# Patient Record
Sex: Male | Born: 1995 | Race: Black or African American | Hispanic: No | Marital: Single | Smoking: Never smoker
Health system: Southern US, Community
[De-identification: ages and names within clinical notes are randomized; demographics above are authoritative.]

---
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
## 2014-09-18 ENCOUNTER — Telehealth: Payer: Self-pay | Admitting: Family Medicine

## 2014-09-18 NOTE — Telephone Encounter (Signed)
Patient Name: Keith Davenport  DOB: 12/19/1995    Initial Comment Caller states, tick bite last week, now has a red spot and a rash    Nurse Assessment  Nurse: Sherilyn CooterHenry, RN, Thurmond ButtsWade Date/Time (Eastern Time): 09/18/2014 10:14:56 AM  Confirm and document reason for call. If symptomatic, describe symptoms. ---Caller states that he had a tick bite last week. He has a rash which he first noticed this morning. He is not able to see the rash so he cannot describe it to me. The rash is under his testicles. Denies fever.  Has the patient traveled out of the country within the last 30 days? ---No  Does the patient require triage? ---Yes  Related visit to physician within the last 2 weeks? ---No  Does the PT have any chronic conditions? (i.e. diabetes, asthma, etc.) ---No     Guidelines    Guideline Title Affirmed Question Affirmed Notes  Tick Bite Red ring or bull's-eye rash occurs around a deer tick bite Unable to describe the rash due to its location.   Final Disposition User   See Physician within 24 Hours Sherilyn CooterHenry, RN, Thurmond ButtsWade    Comments  Appointment scheduled for tomorrow morning at 10:00 with Dr. Bethel BornBruch Burchette.

## 2014-09-19 ENCOUNTER — Encounter: Payer: Self-pay | Admitting: Family Medicine

## 2014-09-19 ENCOUNTER — Ambulatory Visit (INDEPENDENT_AMBULATORY_CARE_PROVIDER_SITE_OTHER): Payer: 59 | Admitting: Family Medicine

## 2014-09-19 VITALS — BP 110/68 | HR 79 | Temp 98.1°F | Wt 153.0 lb

## 2014-09-19 DIAGNOSIS — T148 Other injury of unspecified body region: Secondary | ICD-10-CM | POA: Diagnosis not present

## 2014-09-19 DIAGNOSIS — W57XXXA Bitten or stung by nonvenomous insect and other nonvenomous arthropods, initial encounter: Secondary | ICD-10-CM

## 2014-09-19 NOTE — Progress Notes (Signed)
Pre visit review using our clinic review tool, if applicable. No additional management support is needed unless otherwise documented below in the visit note. 

## 2014-09-19 NOTE — Patient Instructions (Signed)
Tick Bite Information Ticks are insects that attach themselves to the skin and draw blood for food. There are various types of ticks. Common types include wood ticks and deer ticks. Most ticks live in shrubs and grassy areas. Ticks can climb onto your body when you make contact with leaves or grass where the tick is waiting. The most common places on the body for ticks to attach themselves are the scalp, neck, armpits, waist, and groin. Most tick bites are harmless, but sometimes ticks carry germs that cause diseases. These germs can be spread to a person during the tick's feeding process. The chance of a disease spreading through a tick bite depends on:   The type of tick.  Time of year.   How long the tick is attached.   Geographic location.  HOW CAN YOU PREVENT TICK BITES? Take these steps to help prevent tick bites when you are outdoors:  Wear protective clothing. Long sleeves and long pants are best.   Wear white clothes so you can see ticks more easily.  Tuck your pant legs into your socks.   If walking on a trail, stay in the middle of the trail to avoid brushing against bushes.  Avoid walking through areas with long grass.  Put insect repellent on all exposed skin and along boot tops, pant legs, and sleeve cuffs.   Check clothing, hair, and skin repeatedly and before going inside.   Brush off any ticks that are not attached.  Take a shower or bath as soon as possible after being outdoors.  WHAT IS THE PROPER WAY TO REMOVE A TICK? Ticks should be removed as soon as possible to help prevent diseases caused by tick bites. 1. If latex gloves are available, put them on before trying to remove a tick.  2. Using fine-point tweezers, grasp the tick as close to the skin as possible. You may also use curved forceps or a tick removal tool. Grasp the tick as close to its head as possible. Avoid grasping the tick on its body. 3. Pull gently with steady upward pressure until  the tick lets go. Do not twist the tick or jerk it suddenly. This may break off the tick's head or mouth parts. 4. Do not squeeze or crush the tick's body. This could force disease-carrying fluids from the tick into your body.  5. After the tick is removed, wash the bite area and your hands with soap and water or other disinfectant such as alcohol. 6. Apply a small amount of antiseptic cream or ointment to the bite site.  7. Wash and disinfect any instruments that were used.  Do not try to remove a tick by applying a hot match, petroleum jelly, or fingernail polish to the tick. These methods do not work and may increase the chances of disease being spread from the tick bite.  WHEN SHOULD YOU SEEK MEDICAL CARE? Contact your health care provider if you are unable to remove a tick from your skin or if a part of the tick breaks off and is stuck in the skin.  After a tick bite, you need to be aware of signs and symptoms that could be related to diseases spread by ticks. Contact your health care provider if you develop any of the following in the days or weeks after the tick bite:  Unexplained fever.  Rash. A circular rash that appears days or weeks after the tick bite may indicate the possibility of Lyme disease. The rash may resemble   a target with a bull's-eye and may occur at a different part of your body than the tick bite.  Redness and swelling in the area of the tick bite.   Tender, swollen lymph glands.   Diarrhea.   Weight loss.   Cough.   Fatigue.   Muscle, joint, or bone pain.   Abdominal pain.   Headache.   Lethargy or a change in your level of consciousness.  Difficulty walking or moving your legs.   Numbness in the legs.   Paralysis.  Shortness of breath.   Confusion.   Repeated vomiting.  Document Released: 04/02/2000 Document Revised: 01/24/2013 Document Reviewed: 09/13/2012 Exit[employer] Patient Information 2015 [employer]. This information is  not intended to replace advice given to you by your health care provider. Make sure you discuss any questions you have with your health care provider.  

## 2014-09-19 NOTE — Progress Notes (Signed)
   Subjective:    Patient ID: Keith Davenport, male    DOB: 1995-05-17, 19 y.o.   MRN: [id number]  HPI Acute visit. Patient pulled a tick off 8 days ago left side of penis just proximal to the glans. He thinks this was a regular sized tic and not a deer tick. He has not had any rash, headache, or fever. He's had some general malaise. No arthralgias. No generalized skin rash.  No past medical history on file. No past surgical history on file.  reports that he has never smoked. He has never used smokeless tobacco. He reports that he does not drink alcohol or use illicit drugs. family history includes Diabetes in an other family member. No Known Allergies    Review of Systems  Constitutional: Negative for fever and chills.  Genitourinary: Negative for dysuria.  Musculoskeletal: Negative for arthralgias.  Neurological: Negative for headaches.  Hematological: Negative for adenopathy.       Objective:   Physical Exam  Constitutional: He appears well-developed and well-nourished.  Neck: Neck supple.  Cardiovascular: Normal rate and regular rhythm.   Pulmonary/Chest: Effort normal and breath sounds normal. No respiratory distress. He has no wheezes. He has no rales.  Lymphadenopathy:    He has no cervical adenopathy.  Skin: No rash noted.          Assessment & Plan:  Tick bite. He does not have any associated worrisome features such as headache or rash. Education given on things to look out for with handout provided.

## 2014-09-24 ENCOUNTER — Telehealth: Payer: Self-pay | Admitting: *Deleted

## 2014-09-24 NOTE — Telephone Encounter (Signed)
[facility] - Client TELEPHONE ADVICE RECORD [facility] Patient Name: Keith Davenport Gender: Male DOB: April 22, 1995 Age: [age] Y 1 M 20 D Return Phone Number: [phone] (Primary), [phone] (Secondary) Address: City/State/Zip: [place] [address] Client [facility] - Client Client Site [facility] Contact Type Call Call Type Triage / Clinical Relationship To Patient Self Appointment Disposition EMR Appointment Scheduled Info pasted into Epic Yes Return Phone Number [phone] (Primary) Chief Complaint Tick Bite Initial Comment Caller states, tick bite last week, now has a red spot and a rash PreDisposition Call Doctor Nurse Assessment Nurse: Sherilyn CooterHenry, RN, Thurmond ButtsWade Date/Time (Eastern Time): 09/18/2014 10:14:56 AM Confirm and document reason for call. If symptomatic, describe symptoms. ---Caller states that he had a tick bite last week. He has a rash which he first noticed this morning. He is not able to see the rash so he cannot describe it to me. The rash is under his testicles. Denies fever. Has the patient traveled out of the country within the last 30 days? ---No Does the patient require triage? ---Yes Related visit to physician within the last 2 weeks? ---No Does the PT have any chronic conditions? (i.e. diabetes, asthma, etc.) ---No Guidelines Guideline Title Affirmed Question Affirmed Notes Nurse Date/Time (Eastern Time) Tick Bite Red ring or bull's-eye rash occurs around a deer tick bite Unable to describe the rash due to its location. Sherilyn CooterHenry, RN, Thurmond ButtsWade 09/18/2014 10:16:47 AM Disp. Time [name](Eastern Time) Disposition Final User 09/18/2014 10:20:00 AM See Physician within 24 Hours Yes Sherilyn CooterHenry, RN, Leory PlowmanWade Caller Understands: Yes Disagree/Comply: Comply Care Advice Given Per Guideline PLEASE NOTE: All timestamps contained within this report are represented as [place] Standard Time. CONFIDENTIALTY NOTICE:  This fax transmission is intended only for the addressee. It contains information that is legally privileged, confidential or otherwise protected from use or disclosure. If you are not the intended recipient, you are strictly prohibited from reviewing, disclosing, copying using or disseminating any of this information or taking any action in reliance on or regarding this information. If you have received this fax in error, please notify us immediately by telephone so that we can arrange for its return to us. Phone: [phone], Toll-Free: [phone], Fax: [phone] Page: 2 of 2 Call Id: [id number] Care Advice Given Per Guideline SEE PHYSICIAN WITHIN 24 HOURS: * Fever occurs * You become worse. After Care Instructions Given Call Event Type User Date / Time Description Comments User: [name], [name], RN Date/Time [name](Eastern Time): 09/18/2014 10:34:20 AM Appointment scheduled for tomorrow morning at 10:00 with Dr. Bethel BornBruch Burchette.

## 2014-10-28 ENCOUNTER — Emergency Department
Admission: EM | Admit: 2014-10-28 | Discharge: 2014-10-28 | Disposition: A | Discharge status: Home / Self Care | Payer: 59 | Attending: Emergency Medicine | Admitting: Emergency Medicine

## 2014-10-28 ENCOUNTER — Telehealth: Payer: Self-pay | Admitting: Family Medicine

## 2014-10-28 ENCOUNTER — Encounter: Payer: Self-pay | Admitting: Emergency Medicine

## 2014-10-28 DIAGNOSIS — R1030 Lower abdominal pain, unspecified: Secondary | ICD-10-CM

## 2014-10-28 LAB — URINALYSIS COMPLETE WITH MICROSCOPIC (ARMC ONLY)
Bacteria, UA: NONE SEEN
Bilirubin Urine: NEGATIVE
Glucose, UA: NEGATIVE mg/dL
Hgb urine dipstick: NEGATIVE
KETONES UR: NEGATIVE mg/dL
Leukocytes, UA: NEGATIVE
Nitrite: NEGATIVE
PH: 5 (ref 5.0–8.0)
Protein, ur: 30 mg/dL — AB
Specific Gravity, Urine: 1.027 (ref 1.005–1.030)

## 2014-10-28 LAB — COMPREHENSIVE METABOLIC PANEL
ALK PHOS: 66 U/L (ref 38–126)
ALT: 15 U/L — AB (ref 17–63)
ANION GAP: 9 (ref 5–15)
AST: 31 U/L (ref 15–41)
Albumin: 4.5 g/dL (ref 3.5–5.0)
BUN: 13 mg/dL (ref 6–20)
CALCIUM: 9.3 mg/dL (ref 8.9–10.3)
CHLORIDE: 102 mmol/L (ref 101–111)
CO2: 25 mmol/L (ref 22–32)
Creatinine, Ser: 0.9 mg/dL (ref 0.61–1.24)
GFR calc non Af Amer: >60 mL/min (ref >60)
GLUCOSE: 121 mg/dL — AB (ref 65–99)
Potassium: 4.1 mmol/L (ref 3.5–5.1)
Sodium: 136 mmol/L (ref 135–145)
Total Bilirubin: 0.5 mg/dL (ref 0.3–1.2)
Total Protein: 7.5 g/dL (ref 6.5–8.1)

## 2014-10-28 LAB — CBC WITH DIFFERENTIAL/PLATELET
Basophils Absolute: 0.1 10*3/uL (ref 0–0.1)
Basophils Relative: 1 %
EOS PCT: 8 %
Eosinophils Absolute: 0.4 10*3/uL (ref 0–0.7)
HCT: 39.5 % — ABNORMAL LOW (ref 40.0–52.0)
Hemoglobin: 13.3 g/dL (ref 13.0–18.0)
LYMPHS ABS: 1.7 10*3/uL (ref 1.0–3.6)
LYMPHS PCT: 39 %
MCH: 30.4 pg (ref 26.0–34.0)
MCHC: 33.8 g/dL (ref 32.0–36.0)
MCV: 90.1 fL (ref 80.0–100.0)
MONO ABS: 0.3 10*3/uL (ref 0.2–1.0)
MONOS PCT: 6 %
NEUTROS PCT: 46 %
Neutro Abs: 2 10*3/uL (ref 1.4–6.5)
Platelets: 189 10*3/uL (ref 150–440)
RBC: 4.38 MIL/uL — ABNORMAL LOW (ref 4.40–5.90)
RDW: 12.3 % (ref 11.5–14.5)
WBC: 4.4 10*3/uL (ref 3.8–10.6)

## 2014-10-28 LAB — LIPASE, BLOOD: LIPASE: 50 U/L (ref 22–51)

## 2014-10-28 NOTE — Telephone Encounter (Signed)
Patient seen in ED for abdominal pain.  

## 2014-10-28 NOTE — ED Provider Notes (Signed)
[facility] Emergency Department Provider Note  ____________________________________________  Time seen: 2 PM  I have reviewed the triage vital signs and the nursing notes.   HISTORY  Chief Complaint Abdominal Pain    HPI Parvin HAWKINS SEAMAN is a 19 y.o. male who presents with approximately 12 hours of lower abdominal discomfort that is mild that is crampy in nature. He reports it is in bilateral lower quadrantsand it seems to come and go. He is not having the pain now. No fevers no chills. No history of same. No recent injury. No constipation. Normal bowel movements. No penile discharge or scrotal swelling or pain. No recent sexual intercourse     History reviewed. No pertinent past medical history.  Patient Active Problem List   Diagnosis Date Noted  . ACUTE PHARYNGITIS 01/11/2008    History reviewed. No pertinent past surgical history.  No current outpatient prescriptions on file.  Allergies Review of patient's allergies indicates no known allergies.  Family History  Problem Relation Age of Onset  . Diabetes      Social History History  Substance Use Topics  . Smoking status: Never Smoker   . Smokeless tobacco: Never Used  . Alcohol Use: No    Review of Systems  Constitutional: Negative for fever. Eyes: Negative for visual changes. ENT: Negative for sore throat Cardiovascular: Negative for chest pain. Respiratory: Negative for shortness of breath. Gastrointestinal: Positive for abdominal pain Genitourinary: Negative for dysuria. Musculoskeletal: Negative for back pain. Skin: Negative for rash. Neurological: Negative for headaches or focal weakness Psychiatric: No anxiety  10-point ROS otherwise negative.  ____________________________________________   PHYSICAL EXAM:  VITAL SIGNS: ED Triage Vitals  Enc Vitals Group     BP 10/28/14 1249 117/69 mmHg     Pulse Rate 10/28/14 1249 82     Resp --      Temp 10/28/14 1249 98.7  F (37.1 C)     Temp Source 10/28/14 1249 Oral     SpO2 10/28/14 1249 97 %     Weight 10/28/14 1249 155 lb (70.308 kg)     Height 10/28/14 1249 6' (1.829 m)     Head Cir --      Peak Flow --      Pain Score 10/28/14 1249 5     Pain Loc --      Pain Edu? --      Excl. in GC? --      Constitutional: Alert and oriented. Well appearing and in no distress. Eyes: Conjunctivae are normal.  ENT   Head: Normocephalic and atraumatic.   Mouth/Throat: Mucous membranes are moist. Cardiovascular: Normal rate, regular rhythm.  Respiratory: Normal respiratory effort without tachypnea nor retractions. Gastrointestinal: Soft and non-tender in all quadrants. No distention. There is no CVA tenderness. Genitourinary: No Penile discharge, no erythema, no scrotal swelling Musculoskeletal: Nontender with normal range of motion in all extremities. No lower extremity tenderness nor edema. Neurologic:  Normal speech and language. No gross focal neurologic deficits are appreciated. Skin:  Skin is warm, dry and intact. No rash noted. Psychiatric: Mood and affect are normal. Patient exhibits appropriate insight and judgment.  ____________________________________________    LABS (pertinent positives/negatives)  Labs Reviewed  CBC WITH DIFFERENTIAL/PLATELET - Abnormal; Notable for the following:    RBC 4.38 (*)    HCT 39.5 (*)    All other components within normal limits  COMPREHENSIVE METABOLIC PANEL - Abnormal; Notable for the following:    Glucose, Bld 121 (*)  ALT 15 (*)    All other components within normal limits  URINALYSIS COMPLETEWITH MICROSCOPIC ([facility] ONLY) - Abnormal; Notable for the following:    Color, Urine YELLOW (*)    APPearance CLEAR (*)    Protein, ur 30 (*)    Squamous Epithelial / LPF 0-5 (*)    All other components within normal limits  LIPASE, BLOOD    ____________________________________________   EKG  None  ____________________________________________     RADIOLOGY I have personally reviewed any xrays that were ordered on this patient: None  ____________________________________________   PROCEDURES  Procedure(s) performed: none  Critical Care performed: none  ____________________________________________   INITIAL IMPRESSION / ASSESSMENT AND PLAN / ED COURSE  Pertinent labs & imaging results that were available during my care of the patient were reviewed by me and considered in my medical decision making (see chart for details).  Patient well-appearing and in no distress. Sitting up on the edge of bed. Abdominal exam is unremarkable with no tenderness to palpation. Patient does not have any pain in the emergency department. Discussed with patient and father that labs are benign, exam is benign and I would recommend 24-hour period to see if pain resolves or worsens. If it worsens he is to see his physician or to come back to the ED. Patient understands this and agrees to the plan  ____________________________________________   FINAL CLINICAL IMPRESSION(S) / ED DIAGNOSES  Final diagnoses:  Lower abdominal pain     Jene Everyobert Kasiya Burck, MD 10/28/14 1434

## 2014-10-28 NOTE — ED Notes (Signed)
States he developed pain to right mid abd last pm  Now having pain to left mid abd. Positive nausea and diarrhea

## 2014-10-28 NOTE — Discharge Instructions (Signed)

## 2014-10-28 NOTE — Telephone Encounter (Signed)
[facility] - Client TELEPHONE ADVICE RECORD   [facility]    --------------------------------------------------------------------------------   Patient Name: Keith Davenport  Gender: Male  DOB: Nov 04, 1995   Age: [age] Y 3 M  Return Phone Number: [phone] (Primary)  Address: [address]  [address] [place]  City/State/Zip: [place]  [id number]   Client [facility] - Client  Client Site [facility]  Physician Loreen FreudLowne, Yvonne   Contact Type Call  Call Type Triage / Clinical  Relationship To Patient Self  Appointment Disposition EMR Appointment Attempted - Not Scheduled  Info pasted into Epic Yes  Return Phone Number [phone] (Primary)  Chief Complaint Abdominal Pain  Initial Comment Caller states having ABD pain, and when pressing down it hurts   GOTO Facility Not Listed [facility]  PreDisposition Call Doctor       Nurse Assessment  Nurse: Arnold LongMaples, RN, Trish Date/Time (Eastern Time): 10/28/2014 12:08:05 PM  Confirm and document reason for call. If symptomatic, describe symptoms. ---Patient is calling for self and caller states having ABD pain, and when pressing down it hurts right side then moved to left side. Diarrhea started 2 days ago 1 stool a day. No vomiting. No temp. Voids at least 1 time Q 8 hours.    Has the patient traveled out of the country within the last 30 days? ---No    Does the patient require triage? ---Yes    Related visit to physician within the last 2 weeks? ---No    Does the PT have any chronic conditions? (i.e. diabetes, asthma, etc.) ---No           Guidelines          Guideline Title Affirmed Question Affirmed Notes Nurse Date/Time (Eastern Time)  Abdominal Pain (Male) [1] MODERATE pain (interferes with activities) AND [2] Constant AND [3] present > 4 hours    Arnold LongMaples, RN, Trish 10/28/2014 12:11:39 PM    Disp. Time [name](Eastern Time) Disposition  Final User         10/28/2014 12:13:22 PM See Physician within 4 Hours (or PCP triage) Yes Arnold LongMaples, RN, Rosann Auerbachrish            Caller Understands: Yes  Disagree/Comply: Comply       Care Advice Given Per Guideline        SEE PHYSICIAN WITHIN 4 HOURS (or PCP triage): PREPARE FOR VOMITING: Keep a vomiting pan handy. Younger children often refer to nausea as a 'stomachache.' REST: Encourage lying down and rest until seen. NPO: Do not allow any eating or drinking. Also avoid pain medicines. (Reason: just in case condition needs surgery and general anesthesia.) CARE ADVICE given per Abdominal Pain (Male) Pediatric guideline.    After Care Instructions Given        Call Event Type User Date / Time Description        --------------------------------------------------------------------------------            Referrals  GO TO FACILITY UNDECIDED                [facility]

## 2015-07-05 IMAGING — CR DG CHEST 2V
1 series · 2 of 2 positions shown · non-contrast
Comparison: None.

CLINICAL DATA: Right-sided chest pain and shortness of breath 2 hr.
Smoker.

EXAM:
CHEST  2 VIEW

[Series 1: w chest pa · 0.14mm/px · 2 of 2 slices shown]
[im 1/2]
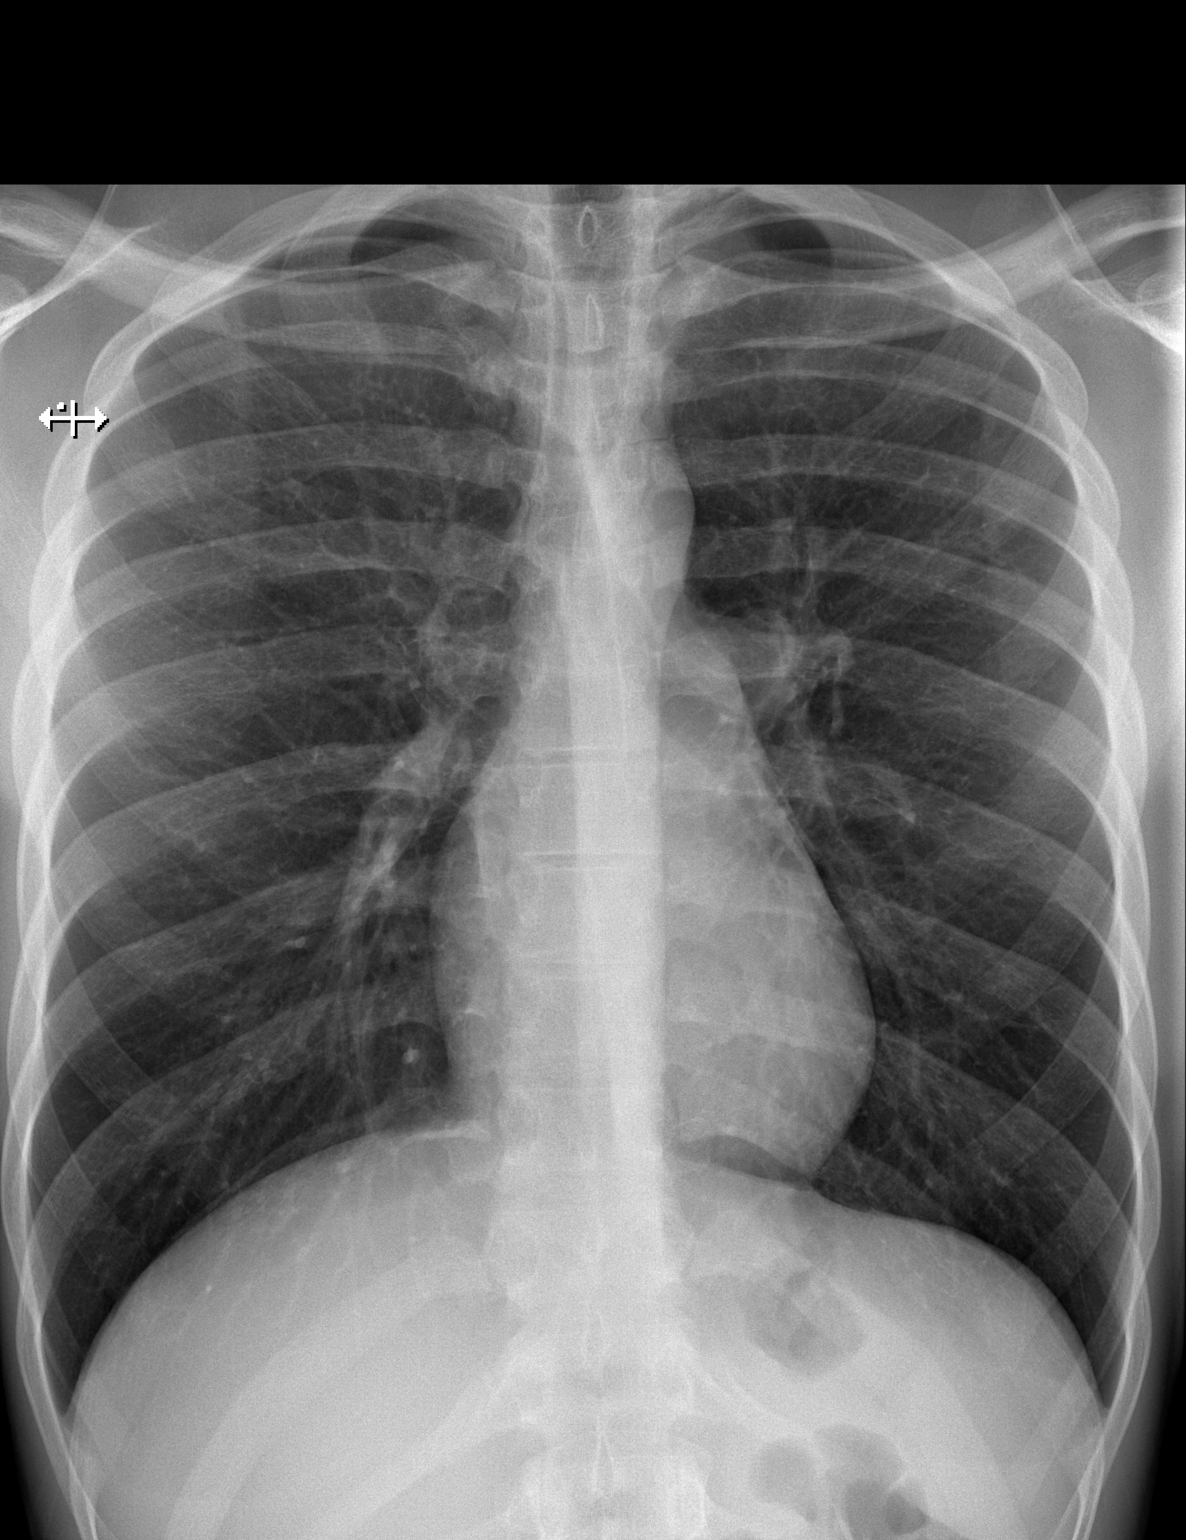
[im 2/2]
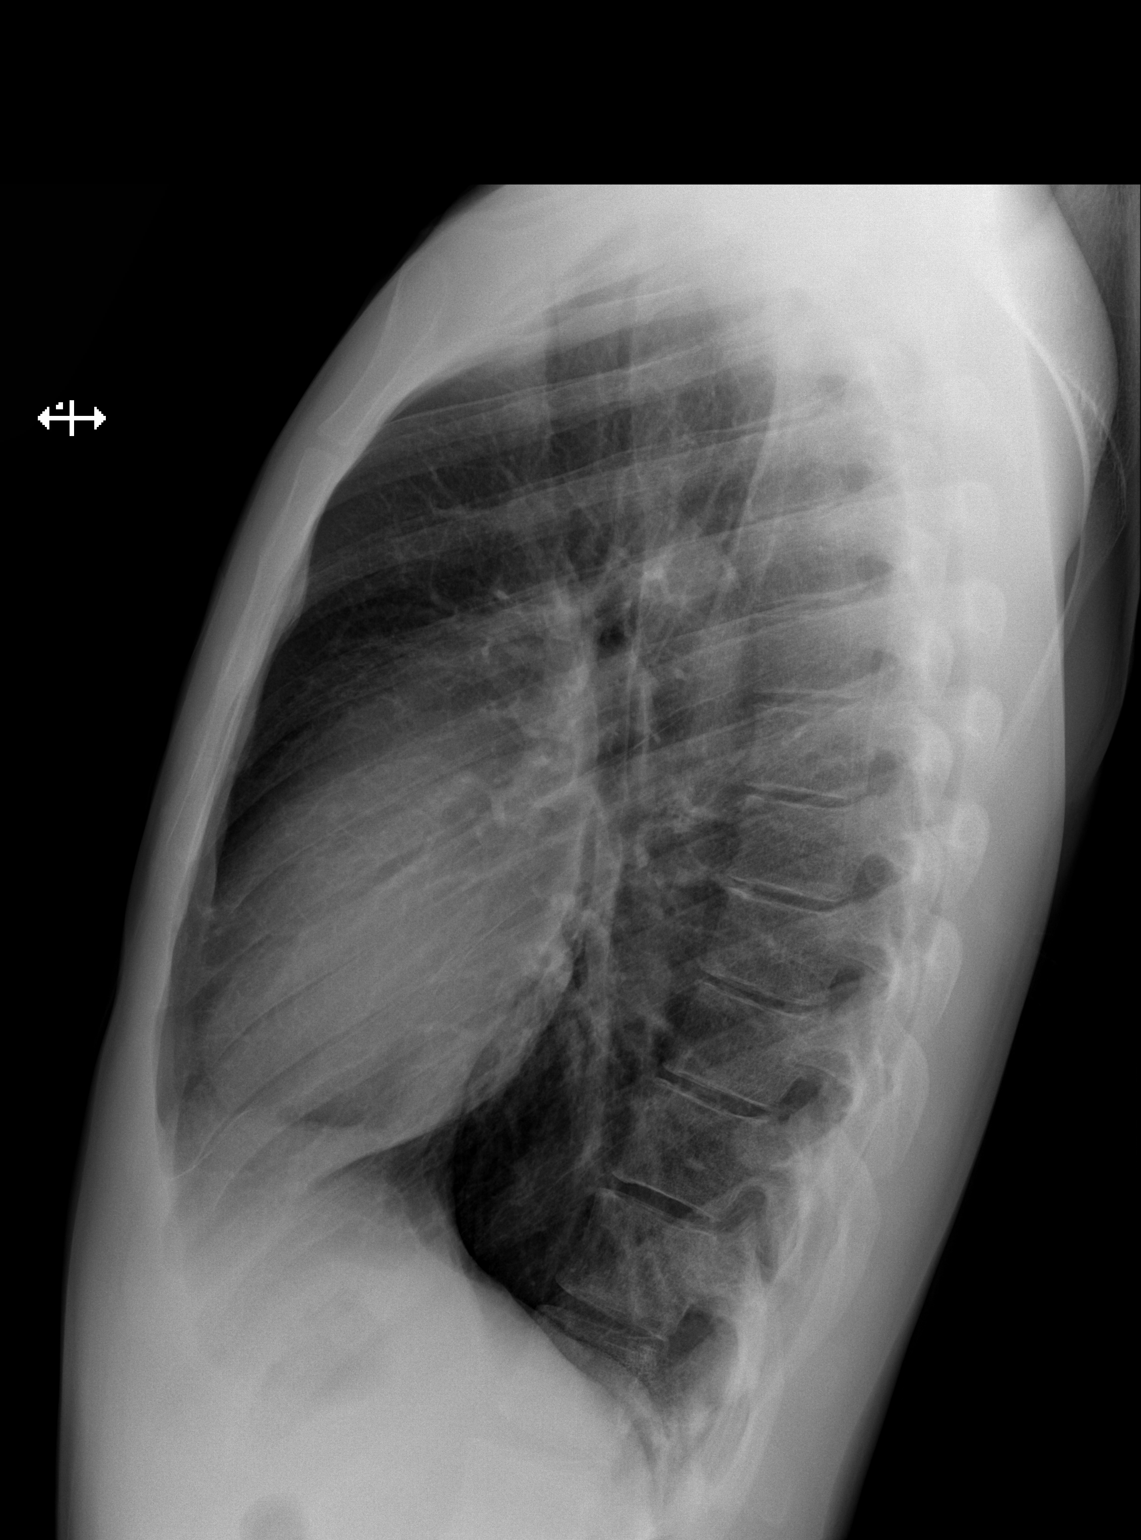

[2 of 2 positions shown; findings below may reference images not displayed]

FINDINGS: The heart size and mediastinal contours are within normal limits.
Both lungs are clear. The visualized skeletal structures are
unremarkable.
IMPRESSION: No active cardiopulmonary disease.

## 2017-06-03 ENCOUNTER — Ambulatory Visit: Payer: 59 | Admitting: Internal Medicine

## 2017-06-03 DIAGNOSIS — Z0289 Encounter for other administrative examinations: Secondary | ICD-10-CM
